# Patient Record
Sex: Female | Born: 1995 | Race: Black or African American | Hispanic: No | Marital: Single | Smoking: Never smoker
Health system: Southern US, Community
[De-identification: ages and names within clinical notes are randomized; demographics above are authoritative.]

## PROBLEM LIST (undated history)

## (undated) HISTORY — PX: TONSILLECTOMY: SUR1361

---

## 2014-03-23 ENCOUNTER — Encounter (HOSPITAL_COMMUNITY): Payer: Self-pay | Admitting: Emergency Medicine

## 2014-03-23 ENCOUNTER — Emergency Department (HOSPITAL_COMMUNITY): Payer: Medicaid - Out of State

## 2014-03-23 ENCOUNTER — Emergency Department (HOSPITAL_COMMUNITY)
Admission: EM | Admit: 2014-03-23 | Discharge: 2014-03-23 | Disposition: A | Discharge status: Home / Self Care | Payer: Medicaid - Out of State | Attending: Emergency Medicine | Admitting: Emergency Medicine

## 2014-03-23 DIAGNOSIS — M25469 Effusion, unspecified knee: Secondary | ICD-10-CM | POA: Insufficient documentation

## 2014-03-23 DIAGNOSIS — M25461 Effusion, right knee: Secondary | ICD-10-CM

## 2014-03-23 MED ORDER — IBUPROFEN 800 MG PO TABS: 800.0000 mg | ORAL_TABLET | Freq: Three times a day (TID) | ORAL | Status: DC

## 2014-03-23 MED ORDER — IBUPROFEN 400 MG PO TABS
800.0000 mg | ORAL_TABLET | Freq: Once | ORAL | Status: AC
  Administered 2014-03-23: 800 mg via ORAL
  Filled 2014-03-23: qty 2

## 2014-03-23 MED ORDER — METHOCARBAMOL 500 MG PO TABS: 500.0000 mg | ORAL_TABLET | Freq: Two times a day (BID) | ORAL | Status: DC

## 2014-03-23 NOTE — ED Notes (Signed)
She woke 2 days ago with R knee pain and swelling. Denies any injuries

## 2014-03-23 NOTE — ED Provider Notes (Signed)
  Medical screening examination/treatment/procedure(s) were performed by non-physician practitioner and as supervising physician I was immediately available for consultation/collaboration.   EKG Interpretation None         Kenna Seward, MD 03/23/14 1334 

## 2014-03-23 NOTE — ED Provider Notes (Signed)
CSN: [id number]     Arrival date & time 03/23/14  4098 History   First MD Initiated Contact with Patient 03/23/14 1009   This chart was scribed for Fayrene Helper PA-C, a non-physician practitioner working with Gerhard Munch, MD by Lewanda Rife, ED Scribe. This patient was seen in room TR06C/TR06C and the patient's care was started at 10:29 AM      Chief Complaint  Patient presents with  . Knee Pain     (Consider location/radiation/quality/duration/timing/severity/associated sxs/prior Treatment) The history is provided by the patient. No language interpreter was used.   HPI Comments: Bonnie Wallace is a 18 y.o. female who presents to the Emergency Department complaining of constant worsening right knee pain onset 2 days. Describes pain as sharp, diffuse, States she walks around at work. Reports associated swelling. Reports pain is exacerbated at night and weight bearing. Denies any alleviating factors. Denies associated recent trauma, injuries, chills, warmth, erythema, fever, repetitive motion, and strenuous activity/sports.     History reviewed. No pertinent past medical history. History reviewed. No pertinent past surgical history. History reviewed. No pertinent family history. History  Substance Use Topics  . Smoking status: Never Smoker   . Smokeless tobacco: Not on file  . Alcohol Use: Yes   OB History   Grav Para Term Preterm Abortions TAB SAB Ect Mult Living                 Review of Systems  Constitutional: Negative for fever.  Musculoskeletal: Positive for arthralgias.  Psychiatric/Behavioral: Negative for confusion.      Allergies  Review of patient's allergies indicates no known allergies.  Home Medications  No current outpatient prescriptions on file. BP 113/75  Pulse 60  Temp(Src) 98.4 F (36.9 C) (Oral)  Resp 18  Wt 169 lb (76.658 kg)  SpO2 100% Physical Exam  Nursing note and vitals reviewed. Constitutional: She is oriented to person, place,  and time. She appears well-developed and well-nourished. No distress.  HENT:  Head: Normocephalic and atraumatic.  Eyes: EOM are normal.  Neck: Neck supple. No tracheal deviation present.  Cardiovascular: Normal rate.   Pulses:      Dorsalis pedis pulses are 2+ on the right side.       Posterior tibial pulses are 2+ on the right side.  Brisk cap refill of all toes of right knee   Pulmonary/Chest: Effort normal. No respiratory distress.  Musculoskeletal:       Right knee: She exhibits decreased range of motion and swelling. She exhibits no ecchymosis, no erythema, no LCL laxity and no MCL laxity. Tenderness found. Medial joint line tenderness noted.  Right knee: No pain to tibial tuberosity TTP to superior patellar region  Decreased ROM secondary to pain (with flexion and extension) Swelling is most significant to the medial aspect of the knee.  Negative valgus and varus strain. Negative anterior and posterior drawer test.   Neurological: She is alert and oriented to person, place, and time.  Skin: Skin is warm and dry.  Psychiatric: She has a normal mood and affect. Her behavior is normal.    ED Course  Procedures  COORDINATION OF CARE:  Nursing notes reviewed. Vital signs reviewed. Initial pt interview and examination performed.   10:38 AM-Discussed work up plan with pt at bedside, which includes x-ray of right knee. Pt agrees with plan.  11:13 AM Nursing Notes Reviewed/ Care Coordinated Applicable Imaging Reviewed and incorporated into ED treatment Discussed results and treatment plan with pt. Pt demonstrates understanding  and agrees with plan.  11:15 AM Consulted with Dr. Jeraldine LootsLockwood about pt and agrees with treatment plan  11:27 AM Patient with right knee pain and swelling without any known injuries. On exam patient does have edema noted to the anterior medial aspect of her right knee with decreased range of motion. No joint laxity. No significant risk factors to suggest  septic arthritis. I discussed this with Dr. Jeraldine LootsLockwood.  Plan to provide patient with ice pack, and knee sleeves, rice therapy, and close followup with ortho. Strict return precautions discussed.   12:16 PM Nursing Notes Reviewed/ Care Coordinated Applicable Imaging Reviewed and incorporated into ED treatment Discussed results and treatment plan with pt. Pt demonstrates understanding and agrees with plan.  Treatment plan initiated:Medications - No data to display   Initial diagnostic testing ordered.     Labs Review Labs Reviewed - No data to display Imaging Review Dg Knee Complete 4 Views Right  03/23/2014   CLINICAL DATA:  Knee pain.  Unable to bear weight.  No known injury.  EXAM: RIGHT KNEE - COMPLETE 4+ VIEW  COMPARISON:  None.  FINDINGS: There is a moderate joint effusion. No acute bony abnormality. No fracture, subluxation or dislocation. Joint spaces are maintained.  IMPRESSION: Moderate joint effusion.  No acute bony abnormality.   Electronically Signed   By: Charlett NoseKevin  Dover M.D.   On: 03/23/2014 12:06     EKG Interpretation None      MDM   Final diagnoses:  Swelling of joint of right knee   I have reviewed nursing notes and vital signs. I personally reviewed the imaging tests through PACS system  I reviewed available ER/hospitalization records thought the EMR  I personally performed the services described in this documentation, which was scribed in my presence. The recorded information has been reviewed and is accurate.    Fayrene HelperBowie Kayon Dozier, PA-C 03/23/14 1222

## 2014-03-23 NOTE — ED Notes (Signed)
Patient transported to X-ray 

## 2014-03-23 NOTE — Discharge Instructions (Signed)
Knee Effusion The medical term for having fluid in your knee is effusion. This is often due to an internal derangement of the knee. This means something is wrong inside the knee. Some of the causes of fluid in the knee may be torn cartilage, a torn ligament, or bleeding into the joint from an injury. Your knee is likely more difficult to bend and move. This is often because there is increased pain and pressure in the joint. The time it takes for recovery from a knee effusion depends on different factors, including:   Type of injury.  Your age.  Physical and medical conditions.  Rehabilitation Strategies. How long you will be away from your normal activities will depend on what kind of knee problem you have and how much damage is present. Your knee has two types of cartilage. Articular cartilage covers the bone ends and lets your knee bend and move smoothly. Two menisci, thick pads of cartilage that form a rim inside the joint, help absorb shock and stabilize your knee. Ligaments bind the bones together and support your knee joint. Muscles move the joint, help support your knee, and take stress off the joint itself. CAUSES  Often an effusion in the knee is caused by an injury to one of the menisci. This is often a tear in the cartilage. Recovery after a meniscus injury depends on how much meniscus is damaged and whether you have damaged other knee tissue. Small tears may heal on their own with conservative treatment. Conservative means rest, limited weight bearing activity and muscle strengthening exercises. Your recovery may take up to 6 weeks.  TREATMENT  Larger tears may require surgery. Meniscus injuries may be treated during arthroscopy. Arthroscopy is a procedure in which your surgeon uses a small telescope like instrument to look in your knee. Your caregiver can make a more accurate diagnosis (learning what is wrong) by performing an arthroscopic procedure. If your injury is on the inner margin  of the meniscus, your surgeon may trim the meniscus back to a smooth rim. In other cases your surgeon will try to repair a damaged meniscus with stitches (sutures). This may make rehabilitation take longer, but may provide better long term result by helping your knee keep its shock absorption capabilities. Ligaments which are completely torn usually require surgery for repair. HOME CARE INSTRUCTIONS  Use crutches as instructed.  If a brace is applied, use as directed.  Once you are home, an ice pack applied to your swollen knee may help with discomfort and help decrease swelling.  Keep your knee raised (elevated) when you are not up and around or on crutches.  Only take over-the-counter or prescription medicines for pain, discomfort, or fever as directed by your caregiver.  Your caregivers will help with instructions for rehabilitation of your knee. This often includes strengthening exercises.  You may resume a normal diet and activities as directed. SEEK MEDICAL CARE IF:   There is increased swelling in your knee.  You notice redness, swelling, or increasing pain in your knee.  An unexplained oral temperature above 102 F (38.9 C) develops. SEEK IMMEDIATE MEDICAL CARE IF:   You develop a rash.  You have difficulty breathing.  You have any allergic reactions from medications you may have been given.  There is severe pain with any motion of the knee. MAKE SURE YOU:   Understand these instructions.  Will watch your condition.  Will get help right away if you are not doing well or get worse.  Document Released: 03/04/2004 Document Revised: 03/06/2012 Document Reviewed: 05/08/2008 [facility] Patient Information 2014 [place], [place].  Elastic Bandage and RICE Elastic bandages come in different shapes and sizes. They perform different functions. Your caregiver will help you to decide what is best for your protection, recovery, or rehabilitation following an injury. The following  are some general tips to help you use an elastic bandage.  Use the bandage as directed by the maker of the bandage you are using.  Do not wrap it too tight. This may cut off the circulation of the arm or leg below the bandage.  If part of your body beyond the bandage becomes blue, numb, or swollen, it is too tight. Loosen the bandage as needed to prevent these problems.  See your caregiver or trainer if the bandage seems to be making your problems worse rather than better. Bandages may be a reminder to you that you have an injury. However, they provide very little support. The few pounds of support they provide are minor considering the pressure it takes to injure a joint or tear ligaments. Therefore, the joint will not be able to handle all of the wear and tear it could before the injury. The routine care of many injuries includes Rest, Ice, Compression, and Elevation (RICE).  Rest is required to allow your body to heal. Generally, routine activities can be resumed when comfortable. Injured tendons and bones take about 6 weeks to heal.  Icing the injury helps keep the swelling down and reduces pain. Do not apply ice directly to the skin. Put ice in a plastic bag. Place a towel between the skin and the bag. This will prevent frostbite to the skin. Apply ice bags to the injured area for 15-20 minutes, every 2 hours while awake. Do this for the first 24 to 48 hours, then as directed by your caregiver.  Compression helps keep swelling down, gives support, and helps with discomfort. If an elastic bandage has been applied today, it should be removed and reapplied every 3 to 4 hours. It should not be applied tightly, but firmly enough to keep swelling down. Watch fingers or toes for swelling, bluish discoloration, coldness, numbness, or increased pain. If any of these problems occur, remove the bandage and reapply it more loosely. If these problems persist, contact your caregiver.  Elevation helps reduce  swelling and decreases pain. The injured area (arms, hands, legs, or feet) should be placed near to or above the heart (center of the chest) if able. Persistent pain and inability to use the injured area for more than 2 to 3 days are warning signs. You should see a caregiver for a follow-up visit as soon as possible. Initially, a minor broken bone (hairline fracture) may not be seen on X-rays. It may take 7 to 10 days to finally show up. Continued pain and swelling show that further evaluation and/or X-rays are needed. Make a follow-up visit with your caregiver. A specialist in reading X-rays (radiologist) will read your X-rays again. Finding out the results of your test Not all test results are available during your visit. If your test results are not back during the visit, make an appointment with your caregiver to find out the results. Do not assume everything is normal if you have not heard from your caregiver or the medical facility. It is important for you to follow up on all of your test results. Document Released: 06/04/2002 Document Revised: 03/06/2012 Document Reviewed: 04/15/2008 [facility] Patient Information 2014 [place], [place].

## 2014-03-23 NOTE — ED Notes (Signed)
Pt getting dressed.

## 2014-07-01 ENCOUNTER — Encounter (HOSPITAL_COMMUNITY): Payer: Self-pay | Admitting: Emergency Medicine

## 2014-07-01 ENCOUNTER — Emergency Department (HOSPITAL_COMMUNITY)
Admission: EM | Admit: 2014-07-01 | Discharge: 2014-07-01 | Disposition: A | Discharge status: Home / Self Care | Payer: Medicaid - Out of State | Attending: Emergency Medicine | Admitting: Emergency Medicine

## 2014-07-01 DIAGNOSIS — R109 Unspecified abdominal pain: Secondary | ICD-10-CM | POA: Diagnosis not present

## 2014-07-01 DIAGNOSIS — Z3202 Encounter for pregnancy test, result negative: Secondary | ICD-10-CM | POA: Diagnosis not present

## 2014-07-01 DIAGNOSIS — N390 Urinary tract infection, site not specified: Secondary | ICD-10-CM | POA: Diagnosis not present

## 2014-07-01 DIAGNOSIS — R300 Dysuria: Secondary | ICD-10-CM | POA: Diagnosis present

## 2014-07-01 LAB — URINALYSIS, ROUTINE W REFLEX MICROSCOPIC
Bilirubin Urine: NEGATIVE
Glucose, UA: NEGATIVE mg/dL
Ketones, ur: NEGATIVE mg/dL
Nitrite: NEGATIVE
PROTEIN: 30 mg/dL — AB
Specific Gravity, Urine: 1.018 (ref 1.005–1.030)
UROBILINOGEN UA: 1 mg/dL (ref 0.0–1.0)
pH: 7.5 (ref 5.0–8.0)

## 2014-07-01 LAB — URINE MICROSCOPIC-ADD ON

## 2014-07-01 LAB — POC URINE PREG, ED: Preg Test, Ur: NEGATIVE

## 2014-07-01 LAB — HIV ANTIBODY (ROUTINE TESTING W REFLEX): HIV: NONREACTIVE

## 2014-07-01 LAB — WET PREP, GENITAL
Trich, Wet Prep: NONE SEEN
Yeast Wet Prep HPF POC: NONE SEEN

## 2014-07-01 MED ORDER — NITROFURANTOIN MONOHYD MACRO 100 MG PO CAPS: 100.0000 mg | ORAL_CAPSULE | Freq: Two times a day (BID) | ORAL | Status: AC

## 2014-07-01 NOTE — ED Provider Notes (Signed)
CSN: [id number]     Arrival date & time 07/01/14  1010 History   First MD Initiated Contact with Patient 07/01/14 1020     Chief Complaint  Patient presents with  . Dysuria     (Consider location/radiation/quality/duration/timing/severity/associated sxs/prior Treatment) Patient is a 18 y.o. female presenting with dysuria. The history is provided by the patient.  Dysuria Pain quality:  Sharp and burning Pain severity:  Moderate Onset quality:  Gradual Duration:  3 days Timing:  Constant Progression:  Worsening Chronicity:  New Recent urinary tract infections: no   Relieved by:  None tried Worsened by:  Nothing tried Ineffective treatments:  None tried Urinary symptoms: frequent urination, hematuria and hesitancy   Associated symptoms: flank pain   Associated symptoms: no abdominal pain, no fever, no genital lesions, no nausea, no vaginal discharge and no vomiting   Risk factors: sexually active   Risk factors: no hx of pyelonephritis, not pregnant, no recurrent urinary tract infections, no sexually transmitted infections and no urinary catheter     History reviewed. No pertinent past medical history. History reviewed. No pertinent past surgical history. History reviewed. No pertinent family history. History  Substance Use Topics  . Smoking status: Never Smoker   . Smokeless tobacco: Not on file  . Alcohol Use: Yes   OB History   Grav Para Term Preterm Abortions TAB SAB Ect Mult Living                 Review of Systems  Constitutional: Negative for fever.  Gastrointestinal: Negative for nausea, vomiting and abdominal pain.  Genitourinary: Positive for dysuria and flank pain. Negative for vaginal discharge.  All other systems reviewed and are negative.     Allergies  Review of patient's allergies indicates no known allergies.  Home Medications   Prior to Admission medications   Medication Sig Start Date End Date Taking? Authorizing Provider  cetirizine (ZYRTEC)  10 MG tablet Take 10 mg by mouth daily as needed for allergies.   Yes Historical Provider, MD   BP 109/58  Pulse 79  Temp(Src) 98.3 F (36.8 C) (Oral)  Resp 18  SpO2 99%  LMP 06/03/2014 Physical Exam  Nursing note and vitals reviewed. Constitutional: She is oriented to person, place, and time. She appears well-developed and well-nourished. No distress.  HENT:  Head: Normocephalic and atraumatic.  Mouth/Throat: Oropharynx is clear and moist.  Eyes: Conjunctivae and EOM are normal. Pupils are equal, round, and reactive to light.  Neck: Normal range of motion. Neck supple.  Cardiovascular: Normal rate, regular rhythm and intact distal pulses.   No murmur heard. Pulmonary/Chest: Effort normal and breath sounds normal. No respiratory distress. She has no wheezes. She has no rales.  Abdominal: Soft. She exhibits no distension. There is tenderness in the suprapubic area. There is no rebound, no guarding and no CVA tenderness.  Genitourinary: Uterus normal. There is no rash or tenderness on the right labia. There is no rash or tenderness on the left labia. Cervix exhibits discharge. Cervix exhibits no motion tenderness and no friability. Right adnexum displays no mass and no tenderness. Left adnexum displays no mass and no tenderness. No tenderness around the vagina. Vaginal discharge found.  Thin white discharge  Musculoskeletal: Normal range of motion. She exhibits no edema and no tenderness.  Neurological: She is alert and oriented to person, place, and time.  Skin: Skin is warm and dry. No rash noted. No erythema.  Psychiatric: She has a normal mood and affect. Her behavior  is normal.    ED Course  Procedures (including critical care time) Labs Review Labs Reviewed  WET PREP, GENITAL - Abnormal; Notable for the following:    Clue Cells Wet Prep HPF POC FEW (*)    WBC, Wet Prep HPF POC MODERATE (*)    All other components within normal limits  URINALYSIS, ROUTINE W REFLEX MICROSCOPIC  - Abnormal; Notable for the following:    APPearance HAZY (*)    Hgb urine dipstick LARGE (*)    Protein, ur 30 (*)    Leukocytes, UA MODERATE (*)    All other components within normal limits  URINE MICROSCOPIC-ADD ON - Abnormal; Notable for the following:    Squamous Epithelial / LPF FEW (*)    Bacteria, UA FEW (*)    All other components within normal limits  GC/CHLAMYDIA PROBE AMP  HIV ANTIBODY (ROUTINE TESTING)  POC URINE PREG, ED    Imaging Review No results found.   EKG Interpretation None      MDM   Final diagnoses:  None    Patient with symptoms most consistent with a urinary tract infection. She complains of 3 does of burning with urination and hematuria. She denies any vaginal discharge, itching or other concerns. She was sexually active with only one partner but does not use protection regularly. LMP was approximately one month ago.  No signs concerning for pyelonephritis at this time.  Pelvic exam without acute findings.  UA and UPT pending  11:52 AM UPT neg.  UA consistent with UTI.  Will treat with macrobid.  Gwyneth SproutWhitney Price [place], MD 07/01/14 1155

## 2014-07-01 NOTE — ED Notes (Signed)
She states shes had pain after urination, urinary frequency and hematuria x few days.

## 2014-07-02 LAB — GC/CHLAMYDIA PROBE AMP
CT Probe RNA: NEGATIVE
GC PROBE AMP APTIMA: NEGATIVE

## 2014-10-21 ENCOUNTER — Emergency Department (HOSPITAL_BASED_OUTPATIENT_CLINIC_OR_DEPARTMENT_OTHER)
Admission: EM | Admit: 2014-10-21 | Discharge: 2014-10-22 | Disposition: A | Discharge status: Home / Self Care | Payer: Medicaid - Out of State | Attending: Emergency Medicine | Admitting: Emergency Medicine

## 2014-10-21 ENCOUNTER — Encounter (HOSPITAL_BASED_OUTPATIENT_CLINIC_OR_DEPARTMENT_OTHER): Payer: Self-pay | Admitting: Emergency Medicine

## 2014-10-21 ENCOUNTER — Emergency Department (HOSPITAL_COMMUNITY)
Admission: EM | Admit: 2014-10-21 | Discharge: 2014-10-21 | Discharge status: Against Medical Advice | Payer: Medicaid - Out of State | Attending: Emergency Medicine | Admitting: Emergency Medicine

## 2014-10-21 DIAGNOSIS — H578 Other specified disorders of eye and adnexa: Secondary | ICD-10-CM | POA: Insufficient documentation

## 2014-10-21 DIAGNOSIS — Z79899 Other long term (current) drug therapy: Secondary | ICD-10-CM | POA: Insufficient documentation

## 2014-10-21 DIAGNOSIS — H109 Unspecified conjunctivitis: Secondary | ICD-10-CM

## 2014-10-21 DIAGNOSIS — H1089 Other conjunctivitis: Secondary | ICD-10-CM | POA: Insufficient documentation

## 2014-10-21 DIAGNOSIS — H539 Unspecified visual disturbance: Secondary | ICD-10-CM | POA: Insufficient documentation

## 2014-10-21 MED ORDER — TETRACAINE HCL 0.5 % OP SOLN
1.0000 [drp] | Freq: Once | OPHTHALMIC | Status: AC
  Administered 2014-10-21: 1 [drp] via OPHTHALMIC
  Filled 2014-10-21: qty 2

## 2014-10-21 MED ORDER — CIPROFLOXACIN HCL 0.3 % OP SOLN: 1.0000 [drp] | OPHTHALMIC | Status: AC

## 2014-10-21 MED ORDER — FLUORESCEIN SODIUM 1 MG OP STRP
1.0000 | ORAL_STRIP | Freq: Once | OPHTHALMIC | Status: AC
  Administered 2014-10-21: 1 via OPHTHALMIC
  Filled 2014-10-21: qty 1

## 2014-10-21 MED ORDER — CIPROFLOXACIN HCL 0.3 % OP SOLN
2.0000 [drp] | OPHTHALMIC | Status: DC
  Administered 2014-10-21: 2 [drp] via OPHTHALMIC
  Filled 2014-10-21: qty 2.5

## 2014-10-21 NOTE — ED Notes (Signed)
Pt c/oleft eye redness and blurred vision x2 days.

## 2014-10-21 NOTE — ED Provider Notes (Signed)
CSN: [id number]     Arrival date & time 10/21/14  1829 History   First MD Initiated Contact with Patient 10/21/14 2152     Chief Complaint  Patient presents with  . Eye Problem     (Consider location/radiation/quality/duration/timing/severity/associated sxs/prior Treatment) The history is provided by the patient and medical records. No language interpreter was used.    Bonnie Wallace is a 18 y.o. female  with a hx of tonsillectomy presents to the Emergency Department complaining of gradual, persistent, progressively worsening left eye irritation and blurred vision onset 2 days ago while wearing her contacts (after rubbing her eyes).  Pt reports she has not worn them since.  Pt denies known exposure to an eye infection.  Pt reports that there has been yellowish and thick drainage from the eye with associated lacrimation. NO aggravating or alleviating factors.  Pt does endorse allergic rhinitis treated with Claritin.  Pt denies fever, chills, headache, neck pain, chest pain, SOB.     History reviewed. No pertinent past medical history. Past Surgical History  Procedure Laterality Date  . Tonsillectomy     No family history on file. History  Substance Use Topics  . Smoking status: Never Smoker   . Smokeless tobacco: Not on file  . Alcohol Use: No   OB History   Grav Para Term Preterm Abortions TAB SAB Ect Mult Living                 Review of Systems  Constitutional: Negative for fever, diaphoresis, appetite change, fatigue and unexpected weight change.  HENT: Negative for mouth sores.   Eyes: Positive for discharge, itching and visual disturbance.  Respiratory: Negative for cough, chest tightness, shortness of breath and wheezing.   Cardiovascular: Negative for chest pain.  Gastrointestinal: Negative for nausea, vomiting, abdominal pain, diarrhea and constipation.  Endocrine: Negative for polydipsia, polyphagia and polyuria.  Genitourinary: Negative for dysuria, urgency,  frequency and hematuria.  Musculoskeletal: Negative for back pain and neck stiffness.  Skin: Negative for rash.  Allergic/Immunologic: Negative for immunocompromised state.  Neurological: Negative for syncope, light-headedness and headaches.  Hematological: Does not bruise/bleed easily.  Psychiatric/Behavioral: Negative for sleep disturbance. The patient is not nervous/anxious.       Allergies  Review of patient's allergies indicates no known allergies.  Home Medications   Prior to Admission medications   Medication Sig Start Date End Date Taking? Authorizing Provider  cetirizine (ZYRTEC) 10 MG tablet Take 10 mg by mouth daily as needed for allergies.    Historical Provider, MD  ciprofloxacin (CILOXAN) 0.3 % ophthalmic solution Place 1 drop into the left eye every 2 (two) hours. Administer 1 drop, every 2 hours, while awake, for 2 days. Then 1 drop, every 4 hours, while awake, for the next 5 days. 10/21/14   Maxcine Strong, PA-C  nitrofurantoin, macrocrystal-monohydrate, (MACROBID) 100 MG capsule Take 1 capsule (100 mg total) by mouth 2 (two) times daily. 07/01/14   Gwyneth SproutWhitney Plunkett, MD   BP 129/72  Pulse 72  Temp(Src) 98.3 F (36.8 C) (Oral)  Resp 20  Ht 5\' 9"  (1.753 m)  Wt 168 lb (76.204 kg)  BMI 24.80 kg/m2  SpO2 100%  LMP 09/27/2014 Physical Exam  Nursing note and vitals reviewed. Constitutional: She is oriented to person, place, and time. She appears well-developed and well-nourished. No distress.  HENT:  Head: Normocephalic and atraumatic.  Nose: Nose normal. No mucosal edema or rhinorrhea.  Mouth/Throat: Uvula is midline, oropharynx is clear and moist and mucous membranes  are normal. No uvula swelling. No oropharyngeal exudate, posterior oropharyngeal edema, posterior oropharyngeal erythema or tonsillar abscesses.  No nasal congestion or rhinorrhea  Eyes: Conjunctivae, EOM and lids are normal. Pupils are equal, round, and reactive to light. Lids are everted and  swept, no foreign bodies found. Right eye exhibits no chemosis, no discharge and no exudate. No foreign body present in the right eye. Left eye exhibits no chemosis, no discharge and no exudate. No foreign body present in the left eye. Right conjunctiva is not injected. Right conjunctiva has no hemorrhage. Left conjunctiva is not injected. Left conjunctiva has no hemorrhage.  Slit lamp exam:      The right eye shows no corneal abrasion, no corneal flare, no corneal ulcer, no foreign body, no fluorescein uptake and no anterior chamber bulge.       The left eye shows no corneal abrasion, no corneal flare, no corneal ulcer, no foreign body, no fluorescein uptake and no anterior chamber bulge.  Pupils equal round and reactive to light No vertical, horizontal or rotational nystagmus No Corneal abrasion or  fluorescein uptake No visible foreign body No corneal flare, ulcer or dendritic staining  No herpetic lesions to the face or around the eye Yellow, purulent discharge from left eye  Tono: 12 Left eye  PH: 7 - left eye  Neck: Normal range of motion.  Full range of motion without pain No midline or paraspinal tenderness No nuchal rigidity; no meningeal signs  Cardiovascular: Normal rate, regular rhythm and intact distal pulses.   Pulmonary/Chest: Effort normal. No respiratory distress.  Musculoskeletal: Normal range of motion.  Neurological: She is alert and oriented to person, place, and time.  Mental Status:  Alert, oriented, thought content appropriate. Speech fluent without evidence of aphasia. Able to follow 2 step commands without difficulty.   Cranial Nerves:  II:  Peripheral visual fields grossly normal, pupils equal, round, reactive to light III,IV, VI: ptosis not present, extra-ocular motions intact bilaterally  V,VII: smile symmetric, facial light touch sensation equal VIII: hearing grossly normal bilaterally  IX,X: gag reflex present  XI: bilateral shoulder shrug equal and  strong XII: midline tongue extension   Skin: Skin is warm and dry. She is not diaphoretic. No erythema.  Psychiatric: She has a normal mood and affect.    ED Course  Procedures (including critical care time) Labs Review Labs Reviewed - No data to display  Imaging Review No results found.   EKG Interpretation None      MDM   Final diagnoses:  Bacterial conjunctivitis of left eye   Cassandra Arviso presents with clinical signs and symptoms pf bacterial conjunctivitis and patient is a contact lens wearer.    Ilaisaane Caven presents with symptoms consistent with bacterial conjunctivitis.  Patient presentation consistent with exterior of conjunctivitis.  Purulent discharge exam.  No corneal abrasions, entrapment, consensual photophobia, or dendritic staining with fluorescein study.  Presentation non-concerning for iritis, corneal abrasions, or HSV.  Patient will be given cipro ophthalmic.  Personal hygiene and frequent handwashing discussed.  Pt is not to use her contacts again. Patient advised to followup with ophthalmologist for reevaluation in several days.  Patient verbalizes understanding and is agreeable with discharge.  I have personally reviewed patient's vitals, nursing note and any pertinent labs or imaging.  I performed an focused physical exam; undressed when appropriate .    It has been determined that no acute conditions requiring further emergency intervention are present at this time. The patient/guardian have been advised  of the diagnosis and plan. I reviewed any labs and imaging including any potential incidental findings. We have discussed signs and symptoms that warrant return to the ED and they are listed in the discharge instructions.    Vital signs are stable at discharge.   BP 129/72  Pulse 72  Temp(Src) 98.3 F (36.8 C) (Oral)  Resp 20  Ht 5\' 9"  (1.753 m)  Wt 168 lb (76.204 kg)  BMI 24.80 kg/m2  SpO2 100%  LMP 09/27/2014            Dierdre Forth, PA-C 10/22/14 0131

## 2014-10-21 NOTE — Discharge Instructions (Signed)
1. Medications: ciprofloxacin opthalmic, usual home medications 2. Treatment: rest, drink plenty of fluids, continue to use her contacts - 0 oil contacts and begin with new contacts after 1 week. 3. Follow Up: Please followup with ophthalmology tomorrow; Please return to the ER for worsening symptoms or further changes in vision    Bacterial Conjunctivitis Bacterial conjunctivitis (commonly called pink eye) is redness, soreness, or puffiness (inflammation) of the white part of your eye. It is caused by a germ called bacteria. These germs can easily spread from person to person (contagious). Your eye often will become red or pink. Your eye may also become irritated, watery, or have a thick discharge.  HOME CARE   Apply a cool, clean washcloth over closed eyelids. Do this for 10-20 minutes, 3-4 times a day while you have pain.  Gently wipe away any fluid coming from the eye with a warm, wet washcloth or cotton ball.  Wash your hands often with soap and water. Use paper towels to dry your hands.  Do not share towels or washcloths.  Change or wash your pillowcase every day.  Do not use eye makeup until the infection is gone.  Do not use machines or drive if your vision is blurry.  Stop using contact lenses. Do not use them again until your doctor says it is okay.  Do not touch the tip of the eye drop bottle or medicine tube with your fingers when you put medicine on the eye. GET HELP RIGHT AWAY IF:   Your eye is not better after 3 days of starting your medicine.  You have a yellowish fluid coming out of the eye.  You have more pain in the eye.  Your eye redness is spreading.  Your vision becomes blurry.  You have a fever or lasting symptoms for more than 2-3 days.  You have a fever and your symptoms suddenly get worse.  You have pain in the face.  Your face gets red or puffy (swollen). MAKE SURE YOU:   Understand these instructions.  Will watch this condition.  Will  get help right away if you are not doing well or get worse. Document Released: 09/21/2008 Document Revised: 11/29/2012 Document Reviewed: 08/18/2012 [facility] Patient Information 2015 [employer], [place]. This information is not intended to replace advice given to you by your health care provider. Make sure you discuss any questions you have with your health care provider.

## 2014-10-22 NOTE — ED Provider Notes (Signed)
Medical screening examination/treatment/procedure(s) were performed by non-physician practitioner and as supervising physician I was immediately available for consultation/collaboration.   EKG Interpretation None        Joane Postel, MD 10/22/14 1145 

## 2015-01-09 ENCOUNTER — Encounter (HOSPITAL_BASED_OUTPATIENT_CLINIC_OR_DEPARTMENT_OTHER): Payer: Self-pay | Admitting: *Deleted

## 2015-01-09 ENCOUNTER — Emergency Department (HOSPITAL_BASED_OUTPATIENT_CLINIC_OR_DEPARTMENT_OTHER): Payer: Medicaid - Out of State

## 2015-01-09 ENCOUNTER — Emergency Department (HOSPITAL_BASED_OUTPATIENT_CLINIC_OR_DEPARTMENT_OTHER)
Admission: EM | Admit: 2015-01-09 | Discharge: 2015-01-09 | Disposition: A | Discharge status: Home / Self Care | Payer: Medicaid - Out of State | Attending: Emergency Medicine | Admitting: Emergency Medicine

## 2015-01-09 DIAGNOSIS — M79672 Pain in left foot: Secondary | ICD-10-CM | POA: Insufficient documentation

## 2015-01-09 DIAGNOSIS — M791 Myalgia: Secondary | ICD-10-CM | POA: Diagnosis not present

## 2015-01-09 DIAGNOSIS — R52 Pain, unspecified: Secondary | ICD-10-CM

## 2015-01-09 DIAGNOSIS — Z79899 Other long term (current) drug therapy: Secondary | ICD-10-CM | POA: Insufficient documentation

## 2015-01-09 MED ORDER — IBUPROFEN 800 MG PO TABS
800.0000 mg | ORAL_TABLET | Freq: Once | ORAL | Status: AC
  Administered 2015-01-09: 800 mg via ORAL
  Filled 2015-01-09: qty 1

## 2015-01-09 MED ORDER — IBUPROFEN 800 MG PO TABS: 800.0000 mg | ORAL_TABLET | Freq: Three times a day (TID) | ORAL | Status: AC

## 2015-01-09 NOTE — Discharge Instructions (Signed)
Foot Contusion ° A foot contusion is a deep bruise to the foot. Contusions happen when an injury causes bleeding under the skin. Signs of bruising include pain, puffiness (swelling), and discolored skin. The contusion may turn blue, purple, or yellow. °HOME CARE °· Put ice on the injured area. °¨ Put ice in a plastic bag. °¨ Place a towel between your skin and the bag. °¨ Leave the ice on for 15-20 minutes, 03-04 times a day. °· Only take medicines as told by your doctor. °· Use an elastic wrap only as told. You may remove the wrap for sleeping, showering, and bathing. Take the wrap off if you lose feeling (numb) in your toes, or they turn blue or cold. Put the wrap on more loosely. °· Keep the foot raised (elevated) with pillows. °· If your foot hurts, avoid standing or walking. °· When your doctor says it is okay to use your foot, start using it slowly. If you have pain, lessen how much you use your foot. °· See your doctor as told. °GET HELP RIGHT AWAY IF:  °· You have more redness, puffiness, or pain in your foot. °· Your puffiness or pain does not get better with medicine. °· You lose feeling in your foot, or you cannot move your toes. °· Your foot turns cold or blue. °· You have pain when you move your toes. °· Your foot feels warm. °· Your contusion does not get better in 2 days. °MAKE SURE YOU:  °· Understand these instructions. °· Will watch this condition. °· Will get help right away if you or your child is not doing well or gets worse. °Document Released: 09/21/2008 Document Revised: 06/13/2012 Document Reviewed: 11/16/2011 °[employer]® Patient Information ©2015 [employer]. This information is not intended to replace advice given to you by your health care provider. Make sure you discuss any questions you have with your health care provider. ° °

## 2015-01-09 NOTE — ED Notes (Signed)
Per Pt. No injury.  Pt. Reports pain in the top of the L foot when she walks.

## 2015-01-09 NOTE — ED Provider Notes (Signed)
CSN: [id number]     Arrival date & time 01/09/15  1843 History  This chart was scribed for Bonnie Quarry, MD by Bronson Curb, ED Scribe. This patient was seen in room MH05/MH05 and the patient's care was started at 6:55 PM.     No chief complaint on file.    Patient is a 19 y.o. female presenting with lower extremity pain. The history is provided by the patient. No language interpreter was used.  Foot Pain This is a new problem. The current episode started more than 2 days ago. The problem has been gradually worsening. Pertinent negatives include no chest pain, no abdominal pain, no headaches and no shortness of breath. The symptoms are aggravated by walking. The symptoms are relieved by ice. She has tried a cold compress for the symptoms. The treatment provided mild relief.     HPI Comments: Bonnie Wallace is a 19 y.o. female, with no significant medical history, who presents to the Emergency Department complaining of worsening pain to the top of the left foot that began 3 days ago. Patient denies any falls, injury, or trauma prior to onset, but states she ambulates a lot while at work. There is associated swelling that began in the last 24 hours. She reports the pain is worse when ambulating, but has applied ice to the foot with some relief. She is not on any medications at this time and denies any chances of pregnancy. Patient is a non-smoker, with normal periods and denies history of PE/DVT.   No past medical history on file. Past Surgical History  Procedure Laterality Date  . Tonsillectomy     No family history on file. History  Substance Use Topics  . Smoking status: Never Smoker   . Smokeless tobacco: Not on file  . Alcohol Use: No   OB History    No data available     Review of Systems  Respiratory: Negative for shortness of breath.   Cardiovascular: Negative for chest pain.  Gastrointestinal: Negative for abdominal pain.  Musculoskeletal: Positive for myalgias (left  foot) and gait problem.  Neurological: Negative for headaches.  All other systems reviewed and are negative.     Allergies  Review of patient's allergies indicates no known allergies.  Home Medications   Prior to Admission medications   Medication Sig Start Date End Date Taking? Authorizing Provider  cetirizine (ZYRTEC) 10 MG tablet Take 10 mg by mouth daily as needed for allergies.    Historical Provider, MD  ciprofloxacin (CILOXAN) 0.3 % ophthalmic solution Place 1 drop into the left eye every 2 (two) hours. Administer 1 drop, every 2 hours, while awake, for 2 days. Then 1 drop, every 4 hours, while awake, for the next 5 days. 10/21/14   Hannah Muthersbaugh, PA-C  nitrofurantoin, macrocrystal-monohydrate, (MACROBID) 100 MG capsule Take 1 capsule (100 mg total) by mouth 2 (two) times daily. 07/01/14   Gwyneth Sprout, MD   Triage Vitals: BP 119/71 mmHg  Pulse 91  Temp(Src) 99.5 F (37.5 C) (Oral)  Resp 18  Ht  (1.753 m)  Wt 166 lb (75.297 kg)  BMI 24.50 kg/m2  SpO2 100%  Physical Exam  Constitutional: She is oriented to person, place, and time. She appears well-developed and well-nourished.  HENT:  Head: Normocephalic and atraumatic.  Right Ear: External ear normal.  Left Ear: External ear normal.  Nose: Nose normal.  Mouth/Throat: Oropharynx is clear and moist.  Eyes: Conjunctivae and EOM are normal. Pupils are equal, round, and  reactive to light.  Neck: Normal range of motion. Neck supple. No JVD present. No tracheal deviation present. No thyromegaly present.  Cardiovascular: Normal rate, regular rhythm, normal heart sounds and intact distal pulses.   Pulmonary/Chest: Effort normal and breath sounds normal. She has no wheezes.  Abdominal: Soft. Bowel sounds are normal. She exhibits no mass. There is no tenderness. There is no guarding.  Musculoskeletal: Normal range of motion. She exhibits edema and tenderness.  TTP on the dorsolateral aspect of the left foot.  Slightly swollen on same aspect. Some point tenderness at the base of the 3rd metatarsal. Full active ROM of the left foot and ankle. No calf tenderness.  Lymphadenopathy:    She has no cervical adenopathy.  Neurological: She is alert and oriented to person, place, and time. She has normal reflexes. No cranial nerve deficit or sensory deficit. Gait normal. GCS eye subscore is 4. GCS verbal subscore is 5. GCS motor subscore is 6.  Reflex Scores:      Bicep reflexes are 2+ on the right side and 2+ on the left side.      Patellar reflexes are 2+ on the right side and 2+ on the left side. Strength is normal and equal throughout. Cranial nerves grossly intact. Patient fluent. No gross ataxia and patient able to ambulate without difficulty.  Skin: Skin is warm and dry.  Psychiatric: She has a normal mood and affect. Her behavior is normal. Judgment and thought content normal.  Nursing note and vitals reviewed.   ED Course  Procedures (including critical care time)  DIAGNOSTIC STUDIES: Oxygen Saturation is 100% on room air, normal by my interpretation.    COORDINATION OF CARE: At 1859 Discussed treatment plan with patient which includes imaging and steroid. Patient agrees.    Labs Review Labs Reviewed - No data to display  Imaging Review  Dg Foot Complete Left  01/09/2015   CLINICAL DATA:  Acute left foot pain.  No known injury.  EXAM: LEFT FOOT - COMPLETE 3+ VIEW  COMPARISON:  None.  FINDINGS: There is no evidence of fracture or dislocation. There is no evidence of arthropathy or other focal bone abnormality. Soft tissues are unremarkable.  IMPRESSION: Negative.   Electronically Signed   By: Laveda AbbeJeff  Hu M.D.   On: 01/09/2015 19:40     EKG Interpretation None      MDM   Final diagnoses:  Pain  Left foot pain      I personally performed the services described in this documentation, which was scribed in my presence. The recorded information has been reviewed and  considered.    Bonnie Quarryanielle S Naelle Diegel, MD 01/13/15 1351

## 2015-08-11 IMAGING — CR DG KNEE COMPLETE 4+V*R*
4 series · 4 of 4 positions shown · non-contrast
Comparison: None.

CLINICAL DATA: Knee pain.  Unable to bear weight.  No known injury.

EXAM:
RIGHT KNEE - COMPLETE 4+ VIEW

[t knee ap right]
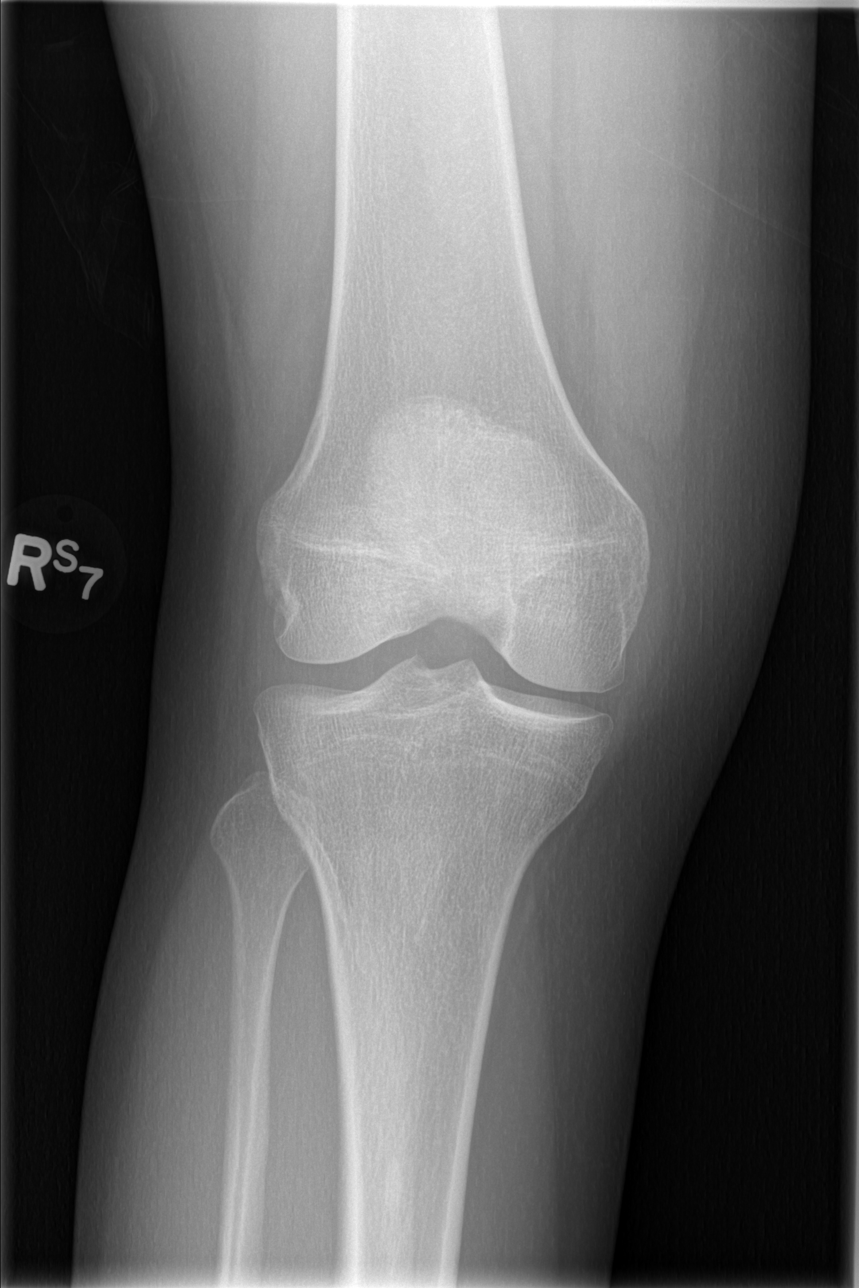

[t knee oblique right (1 of 2)]
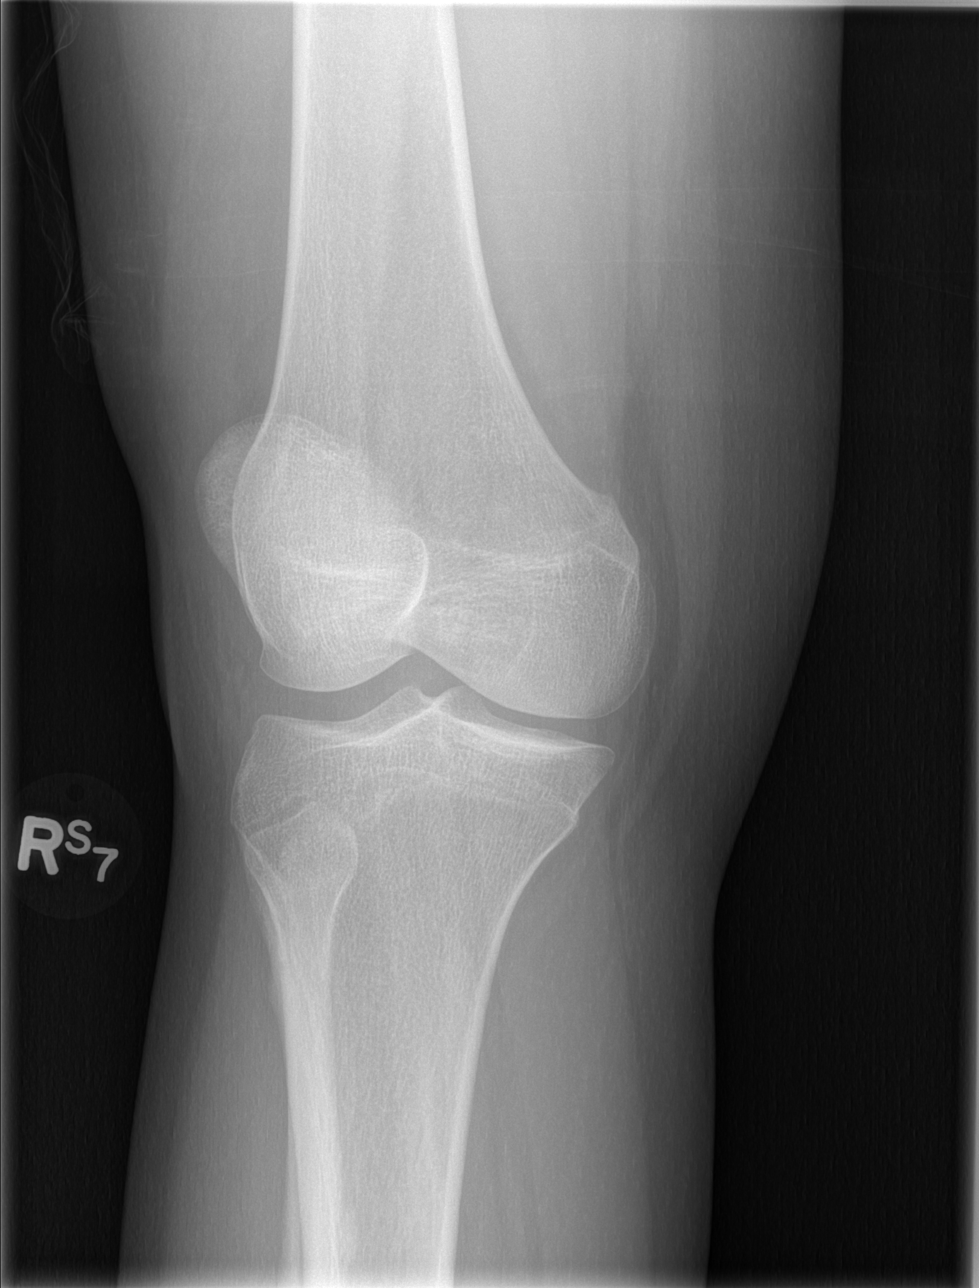

[t knee oblique right (2 of 2)]
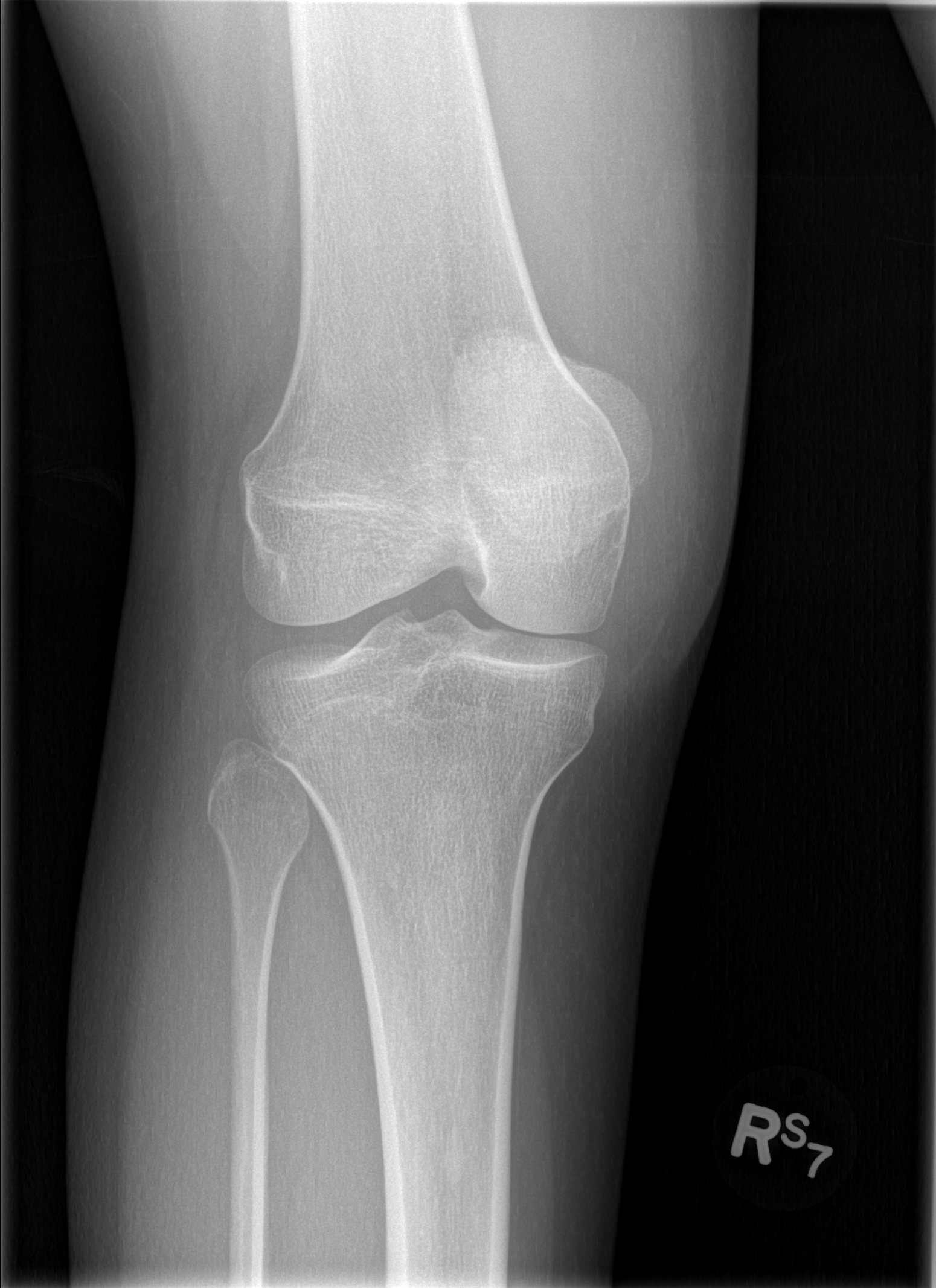

[t knee lat right]
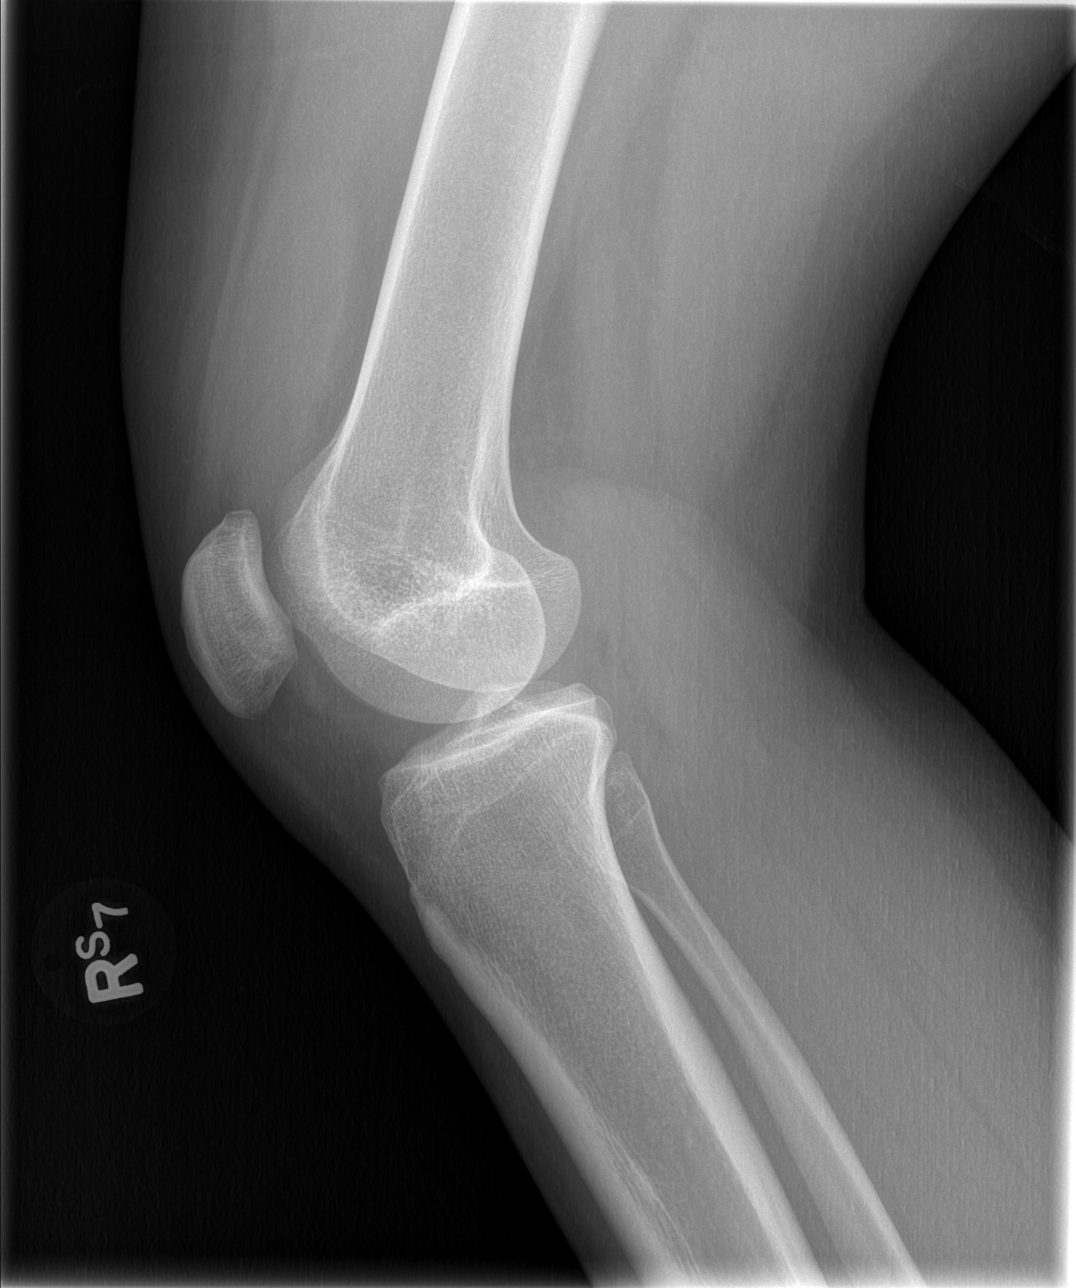

[4 of 4 positions shown; findings below may reference images not displayed]

FINDINGS: There is a moderate joint effusion. No acute bony abnormality. No
fracture, subluxation or dislocation. Joint spaces are maintained.
IMPRESSION: Moderate joint effusion.  No acute bony abnormality.
